# Patient Record
Sex: Female | Born: 1995
Health system: Southern US, Community
[De-identification: ages and names within clinical notes are randomized; demographics above are authoritative.]

## PROBLEM LIST (undated history)

## (undated) DIAGNOSIS — F419 Anxiety disorder, unspecified: Secondary | ICD-10-CM

## (undated) HISTORY — PX: TONSILLECTOMY: SUR1361

## (undated) HISTORY — DX: Anxiety disorder, unspecified: F41.9

---
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
## 2017-02-09 ENCOUNTER — Telehealth: Payer: Self-pay | Admitting: Obstetrics & Gynecology

## 2017-02-09 NOTE — Telephone Encounter (Signed)
[facility] referring to nexplanon . Called and left voicemail for patient to call back to be schedule

## 2017-02-10 NOTE — Telephone Encounter (Signed)
Pt is schedule 02/11/17 with ABC

## 2017-02-10 NOTE — Telephone Encounter (Signed)
[facility] referring for Nexplanon. Pt is schedule 02/14/17 with ABC

## 2017-02-11 ENCOUNTER — Ambulatory Visit: Payer: Self-pay | Admitting: Obstetrics and Gynecology

## 2017-02-11 NOTE — Telephone Encounter (Signed)
PT r/s schedeule to 12/17/18at 10 am with ABC

## 2017-02-11 NOTE — Telephone Encounter (Signed)
Nexplanon stock available for this patient. 

## 2017-02-14 ENCOUNTER — Encounter: Payer: Self-pay | Admitting: Obstetrics and Gynecology

## 2017-02-14 ENCOUNTER — Ambulatory Visit: Payer: Self-pay | Admitting: Obstetrics and Gynecology

## 2017-02-14 ENCOUNTER — Ambulatory Visit (INDEPENDENT_AMBULATORY_CARE_PROVIDER_SITE_OTHER): Payer: Medicaid Other | Admitting: Obstetrics and Gynecology

## 2017-02-14 VITALS — BP 112/68 | HR 77 | Ht 66.0 in | Wt 237.0 lb

## 2017-02-14 DIAGNOSIS — Z30017 Encounter for initial prescription of implantable subdermal contraceptive: Secondary | ICD-10-CM

## 2017-02-14 LAB — POCT URINE PREGNANCY: Preg Test, Ur: NEGATIVE

## 2017-02-14 NOTE — Progress Notes (Signed)
Chief Complaint  Patient presents with  . Contraception    Nexplanon insert/Referred [facility]    HPI:      Teresa Wang is a 21 y.o. G0P0000 who LMP was Patient's last menstrual period was 01/31/2017 (exact date)., presents today for nexplanon insertion, referred by PCP. Pt had nexplanon in the past and was amenorrheic with it, no side effects. She had it removed because she couldn't feel it and started OCPs. She forgets to take OCPs daily, so wants nexplanon again. She stopped OCPs with last menses and has been sex active since, not using condoms/BC.  Menses are monthly, lasting 3-5 days, no BTB. She has dysmen treating with ibup/heating pad with relief.    Past Medical History:  Diagnosis Date  . Anxiety     Past Surgical History:  Procedure Laterality Date  . TONSILLECTOMY      Family History  Problem Relation Age of Onset  . Colon cancer Maternal Grandfather     Social History   Socioeconomic History  . Marital status: Single    Spouse name: Not on file  . Number of children: Not on file  . Years of education: Not on file  . Highest education level: Not on file  Social Needs  . Financial resource strain: Not on file  . Food insecurity - worry: Not on file  . Food insecurity - inability: Not on file  . Transportation needs - medical: Not on file  . Transportation needs - non-medical: Not on file  Occupational History  . Not on file  Tobacco Use  . Smoking status: Never Smoker  . Smokeless tobacco: Never Used  Substance and Sexual Activity  . Alcohol use: No    Frequency: Never  . Drug use: No  . Sexual activity: Yes    Partners: Male    Birth control/protection: None  Other Topics Concern  . Not on file  Social History Narrative  . Not on file     Current Outpatient Medications:  .  buPROPion (WELLBUTRIN XL) 150 MG 24 hr tablet, TK 1 T PO QD, Disp: , Rfl: 2 .  cephALEXin (KEFLEX) 500 MG capsule, TK ONE C PO TID, Disp: , Rfl:  0 .  DIFFERIN 0.3 % gel, , Disp: , Rfl: 3 .  valACYclovir (VALTREX) 1000 MG tablet, TK 2 TS PO BID, Disp: , Rfl: 0   ROS:  Review of Systems  Constitutional: Negative for fever.  Gastrointestinal: Negative for blood in stool, constipation, diarrhea, nausea and vomiting.  Genitourinary: Negative for dyspareunia, dysuria, flank pain, frequency, hematuria, urgency, vaginal bleeding, vaginal discharge and vaginal pain.  Musculoskeletal: Negative for back pain.  Skin: Negative for rash.     OBJECTIVE:   Vitals:  BP 112/68 (BP Location: Left Arm, Patient Position: Sitting, Cuff Size: Large)   Pulse 77   Ht 5\' 6"  (1.676 m)   Wt 237 lb (107.5 kg)   LMP 01/31/2017 (Exact Date)   BMI 38.25 kg/m   Physical Exam  Constitutional: She is oriented to person, place, and time and well-developed, well-nourished, and in no distress.  Neurological: She is alert and oriented to person, place, and time.  Psychiatric: Memory, affect and judgment normal.  Vitals reviewed.   Results: Results for orders placed or performed in visit on 02/14/17 (from the past 24 hour(s))  POCT urine pregnancy     Status: Normal   Collection Time: 02/14/17 10:36 AM  Result Value Ref Range   Preg  Test, Ur Negative Negative     Assessment/Plan: Encounter for initial prescription of implantable subdermal contraceptive - Neg UPT today but not accurate given where she is in cycle, not using any condoms. RTO wtih menses for insertion. Nexplanon insertion/side effects discussed. - Plan: POCT urine pregnancy Condoms in meantime.   Return with menses for insertion.  Alicia B. Copland, PA-C 02/14/2017 10:58 AM

## 2017-02-14 NOTE — Patient Instructions (Signed)
I value your feedback and entrusting us with your care. If you get a [facility] patient survey, I would appreciate you taking the time to let us know about your experience today. Thank you! 

## 2017-03-02 ENCOUNTER — Telehealth: Payer: Self-pay | Admitting: Obstetrics and Gynecology

## 2017-03-02 NOTE — Telephone Encounter (Signed)
1/8 at 8:50 pt will be coming for nexplanon with abc

## 2017-03-08 ENCOUNTER — Ambulatory Visit (INDEPENDENT_AMBULATORY_CARE_PROVIDER_SITE_OTHER): Payer: Medicaid Other | Admitting: Obstetrics and Gynecology

## 2017-03-08 ENCOUNTER — Ambulatory Visit: Payer: Medicaid Other | Admitting: Obstetrics and Gynecology

## 2017-03-08 ENCOUNTER — Encounter: Payer: Self-pay | Admitting: Obstetrics and Gynecology

## 2017-03-08 VITALS — BP 114/70 | Ht 67.0 in

## 2017-03-08 DIAGNOSIS — Z30017 Encounter for initial prescription of implantable subdermal contraceptive: Secondary | ICD-10-CM | POA: Diagnosis not present

## 2017-03-08 MED ORDER — ETONOGESTREL 68 MG ~~LOC~~ IMPL: 68.0000 mg | DRUG_IMPLANT | Freq: Once | SUBCUTANEOUS | Status: DC

## 2017-03-08 NOTE — Patient Instructions (Signed)
I value your feedback and entrusting us with your care. If you get a [place] patient survey, I would appreciate you taking the time to let us know about your experience today. Thank you!  Remove the dressing in 12-24 hours,  keep the incision area dry for 24 hours and remove the Steristrip in 2-3  days.  Notify us if any signs of tenderness, redness, pain, or fevers develop. 

## 2017-03-08 NOTE — Progress Notes (Signed)
   Chief Complaint  Patient presents with  . Contraception     HPI:  Teresa Wang is a 22 y.o. G0P0000 here for Nexplanon insertion. No GYN concerns.  Patient's last menstrual period was 02/28/2017. Not sex active since insertion. [facility]. Had one in the past and did well.   BP 114/70   Ht 5\' 7"  (1.702 m)   LMP 02/28/2017   BMI 37.12 kg/m    Nexplanon Insertion  Patient given informed consent, signed copy in the chart, time out was performed.  Appropriate time out taken.  Patient's LEFT arm was prepped and draped in the usual sterile fashion. The ruler used to measure and mark insertion area.  Pt was prepped with betadine swab and then injected with 1.0 cc of 2% lidocaine with epinephrine. Nexplanon removed form packaging,  Device confirmed in needle, then inserted full length of needle and withdrawn per handbook instructions.  Pt insertion site covered with steri-strip and a bandage.   Minimal blood loss.  Pt tolerated the procedure welL.  Assessment: Nexplanon insertion - Plan: etonogestrel (NEXPLANON) implant 68 mg   Meds ordered this encounter  Medications  . etonogestrel (NEXPLANON) implant 68 mg     Plan:   She was told to remove the dressing in 12-24 hours, to keep the incision area dry for 24 hours and to remove the Steristrip in 2-3  days.  Notify us if any signs of tenderness, redness, pain, or fevers develop.   Per Beagley B. Roen Macgowan, PA-C 03/08/2017 3:50 PM

## 2017-04-26 NOTE — Telephone Encounter (Signed)
Unable to place Nexplanon r/s on next menses.

## 2017-04-26 NOTE — Telephone Encounter (Signed)
Nexplanon received 03/08/17 

## 2018-02-15 DIAGNOSIS — R55 Syncope and collapse: Secondary | ICD-10-CM | POA: Diagnosis not present

## 2018-02-15 DIAGNOSIS — E6609 Other obesity due to excess calories: Secondary | ICD-10-CM | POA: Diagnosis not present

## 2018-02-15 DIAGNOSIS — Z131 Encounter for screening for diabetes mellitus: Secondary | ICD-10-CM | POA: Diagnosis not present

## 2018-02-15 DIAGNOSIS — E668 Other obesity: Secondary | ICD-10-CM | POA: Diagnosis not present

## 2018-02-15 DIAGNOSIS — R5383 Other fatigue: Secondary | ICD-10-CM | POA: Diagnosis not present

## 2018-02-15 DIAGNOSIS — L249 Irritant contact dermatitis, unspecified cause: Secondary | ICD-10-CM | POA: Diagnosis not present

## 2018-03-27 DIAGNOSIS — E668 Other obesity: Secondary | ICD-10-CM | POA: Diagnosis not present

## 2018-03-27 DIAGNOSIS — L732 Hidradenitis suppurativa: Secondary | ICD-10-CM | POA: Diagnosis not present

## 2018-03-27 DIAGNOSIS — R5383 Other fatigue: Secondary | ICD-10-CM | POA: Diagnosis not present

## 2018-03-27 DIAGNOSIS — F419 Anxiety disorder, unspecified: Secondary | ICD-10-CM | POA: Diagnosis not present

## 2018-03-27 DIAGNOSIS — Z1331 Encounter for screening for depression: Secondary | ICD-10-CM | POA: Diagnosis not present

## 2018-07-18 ENCOUNTER — Encounter: Payer: Self-pay | Admitting: Obstetrics and Gynecology

## 2018-07-18 ENCOUNTER — Ambulatory Visit (INDEPENDENT_AMBULATORY_CARE_PROVIDER_SITE_OTHER): Payer: 59 | Admitting: Obstetrics and Gynecology

## 2018-07-18 ENCOUNTER — Other Ambulatory Visit: Payer: Self-pay

## 2018-07-18 VITALS — BP 124/74 | Ht 66.0 in | Wt 233.0 lb

## 2018-07-18 DIAGNOSIS — Z3049 Encounter for surveillance of other contraceptives: Secondary | ICD-10-CM

## 2018-07-18 DIAGNOSIS — Z3046 Encounter for surveillance of implantable subdermal contraceptive: Secondary | ICD-10-CM

## 2018-07-18 NOTE — Progress Notes (Signed)
   Chief Complaint  Patient presents with  . Contraception    Nexplanon removal      History of Present Illness:  ELLYN Wang is a 23 y.o. that had a nexplanon placed 1/19. Since that time, she has had some irregular menses and would rather be off BC. She is sex active and plans to use condoms. She is past due for annual/STD testing and will sched.   Nexplanon removal Pt gave consent for procedure/form signed. Procedure note - The Nexplanon was noted in the patient's arm and the end was identified. The skin was cleansed with a Betadine solution. A small injection of subcutaneous lidocaine with epinephrine was given over the end of the implant. An incision was made at the end of the implant. The rod was noted in the incision and grasped with a hemostat. It was noted to be intact.  Steri-Strip was placed approximating the incision. Hemostasis was noted.  Assessment: Nexplanon removal Use condoms  Plan:   She was told to remove the dressing in 12-24 hours, to keep the incision area dry for 24 hours and to remove the Steristrip in 2-3  days.  Notify us if any signs of tenderness, redness, pain, or fevers develop.  Return in about 4 weeks (around 08/15/2018) for annual.   Alicia B. Copland, PA-C 07/18/2018 3:13 PM

## 2018-07-18 NOTE — Patient Instructions (Addendum)
I value your feedback and entrusting us with your care. If you get a [facility] patient survey, I would appreciate you taking the time to let us know about your experience today. Thank you!  Remove the dressing in 24 hours,  keep the incision area dry for 24 hours and remove the Steristrip in 2-3  days.  Notify us if any signs of tenderness, redness, pain, or fevers develop.  

## 2018-08-21 NOTE — Progress Notes (Signed)
This encounter was created in error - please disregard.

## 2018-08-22 ENCOUNTER — Encounter: Payer: 59 | Admitting: Obstetrics and Gynecology

## 2018-08-24 NOTE — Patient Instructions (Signed)
I value your feedback and entrusting us with your care. If you get a [facility] patient survey, I would appreciate you taking the time to let us know about your experience today. Thank you! 

## 2018-09-11 ENCOUNTER — Other Ambulatory Visit: Payer: Self-pay

## 2018-09-11 ENCOUNTER — Emergency Department
Admission: EM | Admit: 2018-09-11 | Discharge: 2018-09-11 | Disposition: A | Discharge status: Home / Self Care | Payer: 59 | Attending: Emergency Medicine | Admitting: Emergency Medicine

## 2018-09-11 ENCOUNTER — Encounter: Payer: Self-pay | Admitting: Emergency Medicine

## 2018-09-11 DIAGNOSIS — Z79899 Other long term (current) drug therapy: Secondary | ICD-10-CM | POA: Insufficient documentation

## 2018-09-11 DIAGNOSIS — R05 Cough: Secondary | ICD-10-CM | POA: Insufficient documentation

## 2018-09-11 DIAGNOSIS — Z20828 Contact with and (suspected) exposure to other viral communicable diseases: Secondary | ICD-10-CM | POA: Diagnosis not present

## 2018-09-11 DIAGNOSIS — Z20822 Contact with and (suspected) exposure to covid-19: Secondary | ICD-10-CM

## 2018-09-11 NOTE — ED Triage Notes (Signed)
Cough x 3 days.  Also c/o sneezing and runny nose since thrusday.  Also some nausea.

## 2018-09-11 NOTE — ED Notes (Signed)
See triage note  States she developed cough last Thursday  Also has had some runny nose and sneezing  Denies any fever

## 2018-09-11 NOTE — ED Provider Notes (Signed)
[facility] Emergency Department Provider Note  ____________________________________________  Time seen: Approximately 4:40 PM  I have reviewed the triage vital signs and the nursing notes.   HISTORY  Chief Complaint Cough    HPI Teresa Wang is a 23 y.o. female who presents the emergency department complaining of chills, nasal congestion, sore throat, cough, nausea, diarrhea.  Patient reports that she has had 5 days of symptoms prior to arrival.  Patient's boyfriend is also being seen with similar symptoms.  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  Patient has been using multiple over-the-counter medications with no change in her symptoms.  Patient denies any headache, neck pain or stiffness, chest pain, shortness of breath or difficulty breathing, domino pain, nausea or vomiting.          Past Medical History:  Diagnosis Date  . Anxiety     There are no active problems to display for this patient.   Past Surgical History:  Procedure Laterality Date  . TONSILLECTOMY      Prior to Admission medications   Medication Sig Start Date End Date Taking? Authorizing Provider  buPROPion (WELLBUTRIN XL) 150 MG 24 hr tablet TK 1 T PO QD 02/11/17   [provider]  DIFFERIN 0.3 % gel  01/04/17   [provider]    Allergies Penicillins  Family History  Problem Relation Age of Onset  . Colon cancer Maternal Grandfather     Social History Social History   Tobacco Use  . Smoking status: Never Smoker  . Smokeless tobacco: Never Used  Substance Use Topics  . Alcohol use: No    Frequency: Never  . Drug use: No     Review of Systems  Constitutional: No fever but positive for chills. Positive body aches Eyes: No visual changes. No discharge ENT: Positive for nasal congestion and sore throat Cardiovascular: no chest pain. Respiratory: Positive cough. No  SOB. Gastrointestinal: No abdominal pain.  No nausea, no vomiting.  Positive diarrhea.  No constipation. Musculoskeletal: Negative for musculoskeletal pain. Skin: Negative for rash, abrasions, lacerations, ecchymosis. Neurological: Negative for headaches, focal weakness or numbness. 10-point ROS otherwise negative.  ____________________________________________   PHYSICAL EXAM:  VITAL SIGNS: ED Triage Vitals  Enc Vitals Group     BP 09/11/18 1550 135/80     Pulse Rate 09/11/18 1550 95     Resp 09/11/18 1550 16     Temp 09/11/18 1550 98.3 F (36.8 C)     Temp Source 09/11/18 1550 Oral     SpO2 09/11/18 1550 99 %     Weight 09/11/18 1544 233 lb 0.4 oz (105.7 kg)     Height --      Head Circumference --      Peak Flow --      Pain Score 09/11/18 1544 0     Pain Loc --      Pain Edu? --      Excl. in [place]? --      Constitutional: Alert and oriented. Well appearing and in no acute distress. Eyes: Conjunctivae are normal. PERRL. EOMI. Head: Atraumatic. ENT:      Ears:       Nose: No congestion/rhinnorhea.      Mouth/Throat: Mucous membranes are moist.  No oropharyngeal erythema or edema Neck: No stridor.  Neck is supple full range of motion Hematological/Lymphatic/Immunilogical: Scattered, mobile, nontender anterior cervical lymphadenopathy. Cardiovascular: Normal rate, regular rhythm.  Normal S1 and S2.  Good peripheral circulation. Respiratory: Normal respiratory effort without tachypnea or retractions. Lungs CTAB. Good air entry to the bases with no decreased or absent breath sounds. Musculoskeletal: Full range of motion to all extremities. No gross deformities appreciated. Neurologic:  Normal speech and language. No gross focal neurologic deficits are appreciated.  Skin:  Skin is warm, dry and intact. No rash noted. Psychiatric: Mood and affect are normal. Speech and behavior are normal. Patient exhibits appropriate insight and  judgement.   ____________________________________________   LABS (all labs ordered are listed, but only abnormal results are displayed)  Labs Reviewed  NOVEL CORONAVIRUS, NAA (HOSPITAL ORDER, SEND-OUT TO REF LAB)   ____________________________________________  EKG   ____________________________________________  RADIOLOGY   No results found.  ____________________________________________    PROCEDURES  Procedure(s) performed:    Procedures    Medications - No data to display   ____________________________________________   INITIAL IMPRESSION / ASSESSMENT AND PLAN / ED COURSE  Pertinent labs & imaging results that were available during my care of the patient were reviewed by me and considered in my medical decision making (see chart for details).  Review of the [place] CSRS was performed in accordance of the [facility] prior to dispensing any controlled drugs.           Patient's diagnosis is consistent with COVID-19 like infection.  Patient presented to the emergency department with multiple COVID-19 symptoms.  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  Patient's boyfriend is also suffering with similar symptoms.  Multiple over-the-counter medications with no relief.  Exam was overall reassuring with no indication for labs or imaging other than a COVID-19 swab.  Patient is to continue over-the-counter medication for symptom relief.  Follow-up with primary care as needed.  Return to the emergency department for any worsening symptoms..  Patient is given ED precautions to return to the ED for any worsening or new symptoms.     ____________________________________________  FINAL CLINICAL IMPRESSION(S) / ED DIAGNOSES  Final diagnoses:  Suspected Covid-19 Virus Infection      NEW MEDICATIONS STARTED DURING THIS VISIT:  ED Discharge Orders    None          This chart was dictated using voice recognition  software/Dragon. Despite best efforts to proofread, errors can occur which can change the meaning. Any change was purely unintentional.    Racheal PatchesCuthriell,  D, PA-C 09/11/18 1657    Phineas SemenGoodman, [place], MD 09/11/18 1721

## 2018-09-13 LAB — NOVEL CORONAVIRUS, NAA (HOSP ORDER, SEND-OUT TO REF LAB; TAT 18-24 HRS): SARS-CoV-2, NAA: NOT DETECTED

## 2018-10-17 ENCOUNTER — Encounter: Payer: Self-pay | Admitting: Emergency Medicine

## 2018-10-17 ENCOUNTER — Emergency Department: Payer: 59

## 2018-10-17 ENCOUNTER — Other Ambulatory Visit: Payer: Self-pay

## 2018-10-17 ENCOUNTER — Emergency Department
Admission: EM | Admit: 2018-10-17 | Discharge: 2018-10-18 | Disposition: A | Discharge status: Home / Self Care | Payer: 59 | Attending: Emergency Medicine | Admitting: Emergency Medicine

## 2018-10-17 DIAGNOSIS — R05 Cough: Secondary | ICD-10-CM | POA: Diagnosis present

## 2018-10-17 DIAGNOSIS — R0602 Shortness of breath: Secondary | ICD-10-CM

## 2018-10-17 DIAGNOSIS — F329 Major depressive disorder, single episode, unspecified: Secondary | ICD-10-CM | POA: Diagnosis not present

## 2018-10-17 DIAGNOSIS — F32A Depression, unspecified: Secondary | ICD-10-CM

## 2018-10-17 DIAGNOSIS — F419 Anxiety disorder, unspecified: Secondary | ICD-10-CM | POA: Diagnosis not present

## 2018-10-17 DIAGNOSIS — Z79899 Other long term (current) drug therapy: Secondary | ICD-10-CM | POA: Diagnosis not present

## 2018-10-17 DIAGNOSIS — R059 Cough, unspecified: Secondary | ICD-10-CM

## 2018-10-17 NOTE — ED Triage Notes (Addendum)
Pt arrived via POV with reports of dry cough and shortness of breath.  Pt speaking in complete sentences without running out of breath. , denies any history of asthma. Pt reports slight cough as well. Pt reports sxs x 1 week.  Pt reports using e-cigarette -Juul.   Pt also reports sneezing and watery eyes as well.  Pt has been out in the sun recently too.   No distress noted in triage. Pt is not aware of any COVID contacts.

## 2018-10-17 NOTE — ED Notes (Signed)
Pt reports anxiety recently. States that when she feels sob her anxiety increases. Pt is not in any anxiety medication and has no history of anxiety. Pt states she feels anxious right now.

## 2018-10-18 ENCOUNTER — Encounter: Payer: Self-pay | Admitting: Behavioral Health

## 2018-10-18 DIAGNOSIS — R05 Cough: Secondary | ICD-10-CM | POA: Diagnosis not present

## 2018-10-18 DIAGNOSIS — R0602 Shortness of breath: Secondary | ICD-10-CM | POA: Diagnosis not present

## 2018-10-18 DIAGNOSIS — F419 Anxiety disorder, unspecified: Secondary | ICD-10-CM | POA: Diagnosis not present

## 2018-10-18 MED ORDER — HYDROXYZINE HCL 10 MG PO TABS: 10.0000 mg | ORAL_TABLET | Freq: Three times a day (TID) | ORAL | 0 refills | Status: DC | PRN

## 2018-10-18 NOTE — Discharge Instructions (Signed)
You have been seen in the Emergency Department (ED)  today for a psychiatric complaint.  You have been evaluated by psychiatry and we believe you are safe to be discharged from the hospital.   ° °Please return to the Emergency Department (ED)  immediately if you have ANY thoughts of hurting yourself or anyone else, so that we may help you. ° °Please avoid alcohol and drug use. ° °Follow up with your doctor and/or therapist as soon as possible regarding today's ED  visit.  ° °You may call crisis hotline for [place] at [phone]. ° °

## 2018-10-18 NOTE — BH Assessment (Signed)
Pt was given resources for out patient treatment.
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
## 2018-10-18 NOTE — ED Notes (Signed)
URINE PREG NEGATIVE 

## 2018-11-22 ENCOUNTER — Other Ambulatory Visit: Payer: Self-pay

## 2018-11-22 ENCOUNTER — Ambulatory Visit (INDEPENDENT_AMBULATORY_CARE_PROVIDER_SITE_OTHER): Payer: Self-pay | Admitting: Obstetrics and Gynecology

## 2018-11-22 ENCOUNTER — Other Ambulatory Visit: Payer: Self-pay | Admitting: Obstetrics and Gynecology

## 2018-11-22 ENCOUNTER — Encounter: Payer: Self-pay | Admitting: Obstetrics and Gynecology

## 2018-11-22 ENCOUNTER — Other Ambulatory Visit (HOSPITAL_COMMUNITY)
Admission: RE | Admit: 2018-11-22 | Discharge: 2018-11-22 | Disposition: A | Discharge status: Home / Self Care | Payer: Self-pay | Source: Ambulatory Visit | Attending: Obstetrics and Gynecology | Admitting: Obstetrics and Gynecology

## 2018-11-22 VITALS — BP 112/70 | HR 70 | Ht 67.0 in | Wt 227.0 lb

## 2018-11-22 DIAGNOSIS — H9312 Tinnitus, left ear: Secondary | ICD-10-CM

## 2018-11-22 DIAGNOSIS — R300 Dysuria: Secondary | ICD-10-CM

## 2018-11-22 DIAGNOSIS — N926 Irregular menstruation, unspecified: Secondary | ICD-10-CM

## 2018-11-22 DIAGNOSIS — Z113 Encounter for screening for infections with a predominantly sexual mode of transmission: Secondary | ICD-10-CM | POA: Insufficient documentation

## 2018-11-22 DIAGNOSIS — Z3202 Encounter for pregnancy test, result negative: Secondary | ICD-10-CM

## 2018-11-22 DIAGNOSIS — N3001 Acute cystitis with hematuria: Secondary | ICD-10-CM

## 2018-11-22 DIAGNOSIS — Z3490 Encounter for supervision of normal pregnancy, unspecified, unspecified trimester: Secondary | ICD-10-CM

## 2018-11-22 LAB — POCT URINE PREGNANCY: Preg Test, Ur: NEGATIVE

## 2018-11-22 MED ORDER — NITROFURANTOIN MONOHYD MACRO 100 MG PO CAPS: 100.0000 mg | ORAL_CAPSULE | Freq: Two times a day (BID) | ORAL | 1 refills | Status: AC

## 2018-11-22 NOTE — Progress Notes (Signed)
Patient ID: Teresa Wang, female   DOB: 1995-06-27, 23 y.o.   MRN: [id number]  Reason for Consult: Menstrual Problem (Started period on 9/4 then came again on 9/19 both light, taken 3 test, having N/V, headaches, and frequent urination )   Referred by [facility]  Subjective:     HPI:  Teresa Wang is a 23 y.o. female. She is here today for abnormal uterine bleeding.  She reports that earliere this month she had a period on September 4th, 5th, and 6th. She then had bleeding and spotting on the 19th. She has not had bleeding since but the one day of abnormal bleeding was why she scheduled the visit. She reports that she had a nexplanon removed in May of 2020. Since that time she had a monthly period which normally lasts 5-7 days. She has had moderate flow with mild cramps. Her most recent period at the beginning of September was a lighter flow but with terrible cramps.   She is wondering if she might be pregnant and if her bleeding was implantation bleeding. She says that she is not actively trying to conceive but that she would like to be pregnant. She is not using birth control at this time.   She reports that with the Nexplanon she had irregular periods and amenorrhea. She was not happy with the bleeding pattern. However she did use this form of birth control for 4 years. She reports that she has phantom pain in her arm from the Nexplanon. She describes it as a brief sharp pain in the left arm which occurs about once a week.   She has taken OCP in the past but she has a difficult time remebering to take the pill every day.   She reports pain with urination.   She also repots diffuse headaches which generally last for several minutes and then go away. She does not take medication for these headaches.  She denies vision changes. She does not have an aura before the headahce. She does not have nasal drainage associated with the headaches.  She does report a ringing in her  left ear. She has not had a hearing test.    Gynecological History Menarche: 5th grade LMP: November 03, 2018 Describes periods as moderate flow Last pap smear: Never Last Mammogram: never History of STDs: Denies Sexually Active: Yes, denies dyspareunia   Obstetrical History G0P0  Past Medical History:  Diagnosis Date  . Anxiety    Family History  Problem Relation Age of Onset  . Colon cancer Maternal Grandfather    Past Surgical History:  Procedure Laterality Date  . TONSILLECTOMY      Short Social History:  Social History   Tobacco Use  . Smoking status: Never Smoker  . Smokeless tobacco: Never Used  Substance Use Topics  . Alcohol use: No    Frequency: Never    Allergies  Allergen Reactions  . Penicillins Rash    Current Outpatient Medications  Medication Sig Dispense Refill  . buPROPion (WELLBUTRIN XL) 150 MG 24 hr tablet TK 1 T PO QD  2  . DIFFERIN 0.3 % gel   3  . hydrOXYzine (ATARAX/VISTARIL) 10 MG tablet Take 1 tablet (10 mg total) by mouth 3 (three) times daily as needed. 30 tablet 0   Current Facility-Administered Medications  Medication Dose Route Frequency Provider Last Rate Last Dose  . etonogestrel (NEXPLANON) implant 68 mg  68 mg Subdermal Once Copland, Ilona Sorrel, PA-C  Review of Systems  Constitutional: Positive for fatigue. Negative for chills, fever and unexpected weight change.  HENT: Negative for trouble swallowing.  Eyes: Negative for loss of vision.  Respiratory: Negative for cough, shortness of breath and wheezing.       + sneezing Cardiovascular: Negative for chest pain, leg swelling, palpitations and syncope.  GI: Positive for nausea. Negative for abdominal pain, blood in stool, diarrhea and vomiting.       +constipation GU: Positive for frequency. Negative for difficulty urinating, dysuria and hematuria.  Musculoskeletal: Negative for back pain, leg pain and joint pain.       +breast tenderness Skin: Negative for rash.   Neurological: Positive for headaches. Negative for dizziness, light-headedness, numbness and seizures.  Psychiatric: Negative for behavioral problem, confusion, depressed mood and sleep disturbance.        Objective:  Objective   Vitals:   11/22/18 1339  BP: 112/70  Pulse: 70  Weight: 227 lb (103 kg)  Height: 5\' 7"  (1.702 m)   Body mass index is 35.55 kg/m.  Physical Exam Vitals signs and nursing note reviewed.  Constitutional:      Appearance: She is well-developed.  HENT:     Head: Normocephalic and atraumatic.  Eyes:     Pupils: Pupils are equal, round, and reactive to light.  Cardiovascular:     Rate and Rhythm: Normal rate and regular rhythm.  Pulmonary:     Effort: Pulmonary effort is normal. No respiratory distress.  Abdominal:     General: Abdomen is flat.     Palpations: Abdomen is soft.  Genitourinary:    Comments: Declines pelvic exam today Skin:    General: Skin is warm and dry.  Neurological:     Mental Status: She is alert and oriented to person, place, and time.  Psychiatric:        Behavior: Behavior normal.        Thought Content: Thought content normal.        Judgment: Judgment normal.        Assessment/Wang:     23 yo G0P0000 1. Irregular bleeding, one time event. Pregnancy test negative today. Will obtain betahcg to confirm given possible early pregnancy and patient concern. 2. Urinary dysuria and frequency- presumptive UTI treatment, rx and urine culture sent.  3. Headaches and hearing changes, will refer to ENT for further evaluation and hearing testing.  4. Discussed need for pap smear and  STD testing, patient declines pap today. Will need at her next visit.     Adrian Prows MD [facility], [facility] 11/22/2018 2:25 PM

## 2018-11-23 LAB — BETA HCG QUANT (REF LAB): hCG Quant: <1 m[IU]/mL

## 2018-11-23 NOTE — Progress Notes (Signed)
Negative. Released to mychart with a note.

## 2018-11-24 LAB — URINE CYTOLOGY ANCILLARY ONLY
Chlamydia: NEGATIVE
Neisseria Gonorrhea: NEGATIVE
Trichomonas: NEGATIVE

## 2018-11-24 LAB — URINE CULTURE

## 2018-11-27 NOTE — Progress Notes (Signed)
WNL

## 2018-11-27 NOTE — Progress Notes (Signed)
WNL, released to mychart with note

## 2018-12-06 ENCOUNTER — Ambulatory Visit: Payer: Medicaid Other | Admitting: Obstetrics and Gynecology

## 2019-04-09 NOTE — Progress Notes (Signed)
PCP:  [facility], Nature conservation officer Complaint  Patient presents with  . Gynecologic Exam    heavy periods, golf size clots, severe cramping  . Contraception    not interested in depo or nexplanon     HPI:      Ms. Teresa Wang is a 24 y.o. G0P0000 who LMP was Patient's last menstrual period was 03/18/2019 (exact date)., presents today for her annual examination.  Her menses are regular every 28-30 days, lasting 5 days, mod to heavy flow, with clots. Sx worse since nexplanon removed last yr.  Dysmenorrhea moderate, improved with midol/heating pad. She does not have intermenstrual bleeding.  Sex activity: single partner, contraception - none. Nexplanon placed 03/18/17, removed 5/20. Would like BC, but not depo/nexplanon. Did OCPs in past without side effects. No hx of HTN, DVTs, migraines with aura.  Last Pap: never due to age Hx of STDs: none  There is no FH of breast cancer. There is no FH of ovarian cancer. The patient does not do self-breast exams.  Tobacco use: vapes daily Alcohol use: social drinker No drug use.  Exercise: not active  She does not get adequate calcium and Vitamin D in her diet. Unsure about Gardasil vaccine   Past Medical History:  Diagnosis Date  . Anxiety      Past Surgical History:  Procedure Laterality Date  . TONSILLECTOMY      Family History  Problem Relation Age of Onset  . Colon cancer Maternal Grandfather     Social History   Socioeconomic History  . Marital status: Single    Spouse name: Not on file  . Number of children: Not on file  . Years of education: Not on file  . Highest education level: Not on file  Occupational History  . Not on file  Tobacco Use  . Smoking status: Never Smoker  . Smokeless tobacco: Never Used  Substance and Sexual Activity  . Alcohol use: No  . Drug use: No  . Sexual activity: Yes    Partners: Male    Birth control/protection: None  Other Topics Concern  . Not on file  Social  History Narrative  . Not on file   Social Determinants of Health   Financial Resource Strain:   . Difficulty of Paying Living Expenses: Not on file  Food Insecurity:   . Worried About Charity fundraiser in the Last Year: Not on file  . Ran Out of Food in the Last Year: Not on file  Transportation Needs:   . Lack of Transportation (Medical): Not on file  . Lack of Transportation (Non-Medical): Not on file  Physical Activity:   . Days of Exercise per Week: Not on file  . Minutes of Exercise per Session: Not on file  Stress:   . Feeling of Stress : Not on file  Social Connections:   . Frequency of Communication with Friends and Family: Not on file  . Frequency of Social Gatherings with Friends and Family: Not on file  . Attends Religious Services: Not on file  . Active Member of Clubs or Organizations: Not on file  . Attends Archivist Meetings: Not on file  . Marital Status: Not on file  Intimate Partner Violence:   . Fear of Current or Ex-Partner: Not on file  . Emotionally Abused: Not on file  . Physically Abused: Not on file  . Sexually Abused: Not on file     Current Outpatient Medications:  .  Norethindrone Acetate-Ethinyl Estrad-FE (MICROGESTIN 24 FE) 1-20 MG-MCG(24) tablet, Take 1 tablet by mouth daily., Disp: 84 tablet, Rfl: 3    ROS:  Review of Systems  Constitutional: Negative for fatigue, fever and unexpected weight change.  Respiratory: Negative for cough, shortness of breath and wheezing.   Cardiovascular: Negative for chest pain, palpitations and leg swelling.  Gastrointestinal: Negative for blood in stool, constipation, diarrhea, nausea and vomiting.  Endocrine: Negative for cold intolerance, heat intolerance and polyuria.  Genitourinary: Positive for menstrual problem. Negative for dyspareunia, dysuria, flank pain, frequency, genital sores, hematuria, pelvic pain, urgency, vaginal bleeding, vaginal discharge and vaginal pain.  Musculoskeletal:  Negative for back pain, joint swelling and myalgias.  Skin: Negative for rash.  Neurological: Negative for dizziness, syncope, light-headedness, numbness and headaches.  Hematological: Negative for adenopathy.  Psychiatric/Behavioral: Negative for agitation, confusion, sleep disturbance and suicidal ideas. The patient is not nervous/anxious.   BREAST: No symptoms   Objective: BP 110/80   Ht 5\' 7"  (1.702 m)   Wt 249 lb (112.9 kg)   LMP 03/18/2019 (Exact Date)   BMI 39.00 kg/m    Physical Exam Constitutional:      Appearance: She is well-developed.  Genitourinary:     Vulva, vagina, cervix, uterus, right adnexa and left adnexa normal.     No vulval lesion or tenderness noted.     No vaginal discharge, erythema or tenderness.     No cervical polyp.     Uterus is not enlarged or tender.     No right or left adnexal mass present.     Right adnexa not tender.     Left adnexa not tender.  Neck:     Thyroid: No thyromegaly.  Cardiovascular:     Rate and Rhythm: Normal rate and regular rhythm.     Heart sounds: Normal heart sounds. No murmur.  Pulmonary:     Effort: Pulmonary effort is normal.     Breath sounds: Normal breath sounds.  Chest:     Breasts:        Right: No mass, nipple discharge, skin change or tenderness.        Left: No mass, nipple discharge, skin change or tenderness.  Abdominal:     Palpations: Abdomen is soft.     Tenderness: There is no abdominal tenderness. There is no guarding.  Musculoskeletal:        General: Normal range of motion.     Cervical back: Normal range of motion.  Neurological:     General: No focal deficit present.     Mental Status: She is alert and oriented to person, place, and time.     Cranial Nerves: No cranial nerve deficit.  Skin:    General: Skin is warm and dry.  Psychiatric:        Mood and Affect: Mood normal.        Behavior: Behavior normal.        Thought Content: Thought content normal.        Judgment: Judgment  normal.  Vitals reviewed.     Results: Results for orders placed or performed in visit on 04/10/19 (from the past 24 hour(s))  POCT hemoglobin     Status: Normal   Collection Time: 04/10/19 11:45 AM  Result Value Ref Range   Hemoglobin 11.4 11 - 14.6 g/dL    Assessment/Plan: Encounter for annual routine gynecological examination  Cervical cancer screening - Plan: Cytology - PAP  Screening for STD (sexually transmitted disease) - Plan:  Cytology - PAP  Encounter for initial prescription of contraceptive pills - Plan: Norethindrone Acetate-Ethinyl Estrad-FE (MICROGESTIN 24 FE) 1-20 MG-MCG(24) tablet; OCP start with next menses, condoms. Rx eRxd. F/u prn.   Menometrorrhagia - Plan: POCT hemoglobin, Norethindrone Acetate-Ethinyl Estrad-FE (MICROGESTIN 24 FE) 1-20 MG-MCG(24) tablet; Low normal HgB. Start OCPs. F/u prn.  Meds ordered this encounter  Medications  . Norethindrone Acetate-Ethinyl Estrad-FE (MICROGESTIN 24 FE) 1-20 MG-MCG(24) tablet    Sig: Take 1 tablet by mouth daily.    Dispense:  84 tablet    Refill:  3    Order Specific Question:   Supervising Provider    Answer:   Nadara Mustard [[id number]]             GYN counsel use and side effects of OCP's, adequate intake of calcium and vitamin D, diet and exercise     F/U  Return in about 1 year (around 04/09/2020).  Foy Vanduyne B. Areyana Leoni, PA-C 04/10/2019 11:53 AM

## 2019-04-10 ENCOUNTER — Other Ambulatory Visit: Payer: Self-pay

## 2019-04-10 ENCOUNTER — Encounter: Payer: Self-pay | Admitting: Obstetrics and Gynecology

## 2019-04-10 ENCOUNTER — Ambulatory Visit (INDEPENDENT_AMBULATORY_CARE_PROVIDER_SITE_OTHER): Payer: Medicaid Other | Admitting: Obstetrics and Gynecology

## 2019-04-10 ENCOUNTER — Other Ambulatory Visit (HOSPITAL_COMMUNITY)
Admission: RE | Admit: 2019-04-10 | Discharge: 2019-04-10 | Disposition: A | Discharge status: Home / Self Care | Payer: Medicaid Other | Source: Ambulatory Visit | Attending: Obstetrics and Gynecology | Admitting: Obstetrics and Gynecology

## 2019-04-10 VITALS — BP 110/80 | Ht 67.0 in | Wt 249.0 lb

## 2019-04-10 DIAGNOSIS — Z124 Encounter for screening for malignant neoplasm of cervix: Secondary | ICD-10-CM | POA: Diagnosis not present

## 2019-04-10 DIAGNOSIS — Z01419 Encounter for gynecological examination (general) (routine) without abnormal findings: Secondary | ICD-10-CM

## 2019-04-10 DIAGNOSIS — N921 Excessive and frequent menstruation with irregular cycle: Secondary | ICD-10-CM

## 2019-04-10 DIAGNOSIS — Z113 Encounter for screening for infections with a predominantly sexual mode of transmission: Secondary | ICD-10-CM | POA: Insufficient documentation

## 2019-04-10 DIAGNOSIS — Z30011 Encounter for initial prescription of contraceptive pills: Secondary | ICD-10-CM

## 2019-04-10 LAB — POCT HEMOGLOBIN: Hemoglobin: 11.4 g/dL (ref 11–14.6)

## 2019-04-10 MED ORDER — MICROGESTIN 24 FE 1-20 MG-MCG PO TABS: 1.0000 | ORAL_TABLET | Freq: Every day | ORAL | 3 refills | Status: DC

## 2019-04-10 NOTE — Patient Instructions (Signed)
I value your feedback and entrusting us with your care. If you get a [facility] patient survey, I would appreciate you taking the time to let us know about your experience today. Thank you!  As of February 08, 2019, your lab results will be released to your MyChart immediately, before I even have a chance to see them. Please give me time to review them and contact you if there are any abnormalities. Thank you for your patience.  

## 2019-04-12 LAB — CYTOLOGY - PAP
Chlamydia: NEGATIVE
Comment: NEGATIVE
Comment: NORMAL
Diagnosis: NEGATIVE
Neisseria Gonorrhea: NEGATIVE

## 2019-05-02 ENCOUNTER — Encounter: Payer: Self-pay | Admitting: Obstetrics and Gynecology

## 2019-05-16 ENCOUNTER — Encounter: Payer: Self-pay | Admitting: Obstetrics and Gynecology

## 2019-07-17 ENCOUNTER — Encounter: Payer: Self-pay | Admitting: Obstetrics and Gynecology

## 2019-07-17 ENCOUNTER — Telehealth: Payer: Medicaid Other | Admitting: Physician Assistant

## 2019-07-17 DIAGNOSIS — N926 Irregular menstruation, unspecified: Secondary | ICD-10-CM

## 2019-07-17 NOTE — Progress Notes (Signed)
Hi Dorene,   If desired, it is a common practice for women to take birth control pills in such a manner that it causes a missed period.  You will need to speak with your PCP or GYN directly  regarding more in depth specifics on this topic and/or if you would like a change in your birth control. This is not a problem we cover in E-visits.   Based on what you shared with me, I feel your condition warrants further evaluation and I recommend that you be seen for a face to face visit.  Please contact your primary care physician practice to be seen. Many offices offer virtual options to be seen via video if you are not comfortable going in person to a medical facility at this time.  If you do not have a PCP, [facility] offers a free physician referral service available at [phone]. Our trained staff has the experience, knowledge and resources to put you in touch with a physician who is right for you.   You also have the option of a video visit through [email or web address]  If you are having a true medical emergency please call 911.  NOTE: If you entered your credit card information for this eVisit, you will not be charged. You may see a "hold" on your card for the $35 but that hold will drop off and you will not have a charge processed.  Your e-visit answers were reviewed by a board certified advanced clinical practitioner to complete your personal care plan.  Thank you for using e-Visits.

## 2020-01-01 ENCOUNTER — Telehealth: Payer: Self-pay

## 2020-01-01 ENCOUNTER — Other Ambulatory Visit: Payer: Self-pay

## 2020-01-01 DIAGNOSIS — Z30011 Encounter for initial prescription of contraceptive pills: Secondary | ICD-10-CM

## 2020-01-01 DIAGNOSIS — N921 Excessive and frequent menstruation with irregular cycle: Secondary | ICD-10-CM

## 2020-01-01 MED ORDER — MICROGESTIN 24 FE 1-20 MG-MCG PO TABS: 1.0000 | ORAL_TABLET | Freq: Every day | ORAL | 3 refills | Status: DC

## 2020-01-05 IMAGING — DX PORTABLE CHEST - 1 VIEW
1 series · 1 of 1 positions shown · non-contrast
Comparison: None.

CLINICAL DATA: Dry cough, shortness of breath

EXAM:
PORTABLE CHEST 1 VIEW

[chest ap]
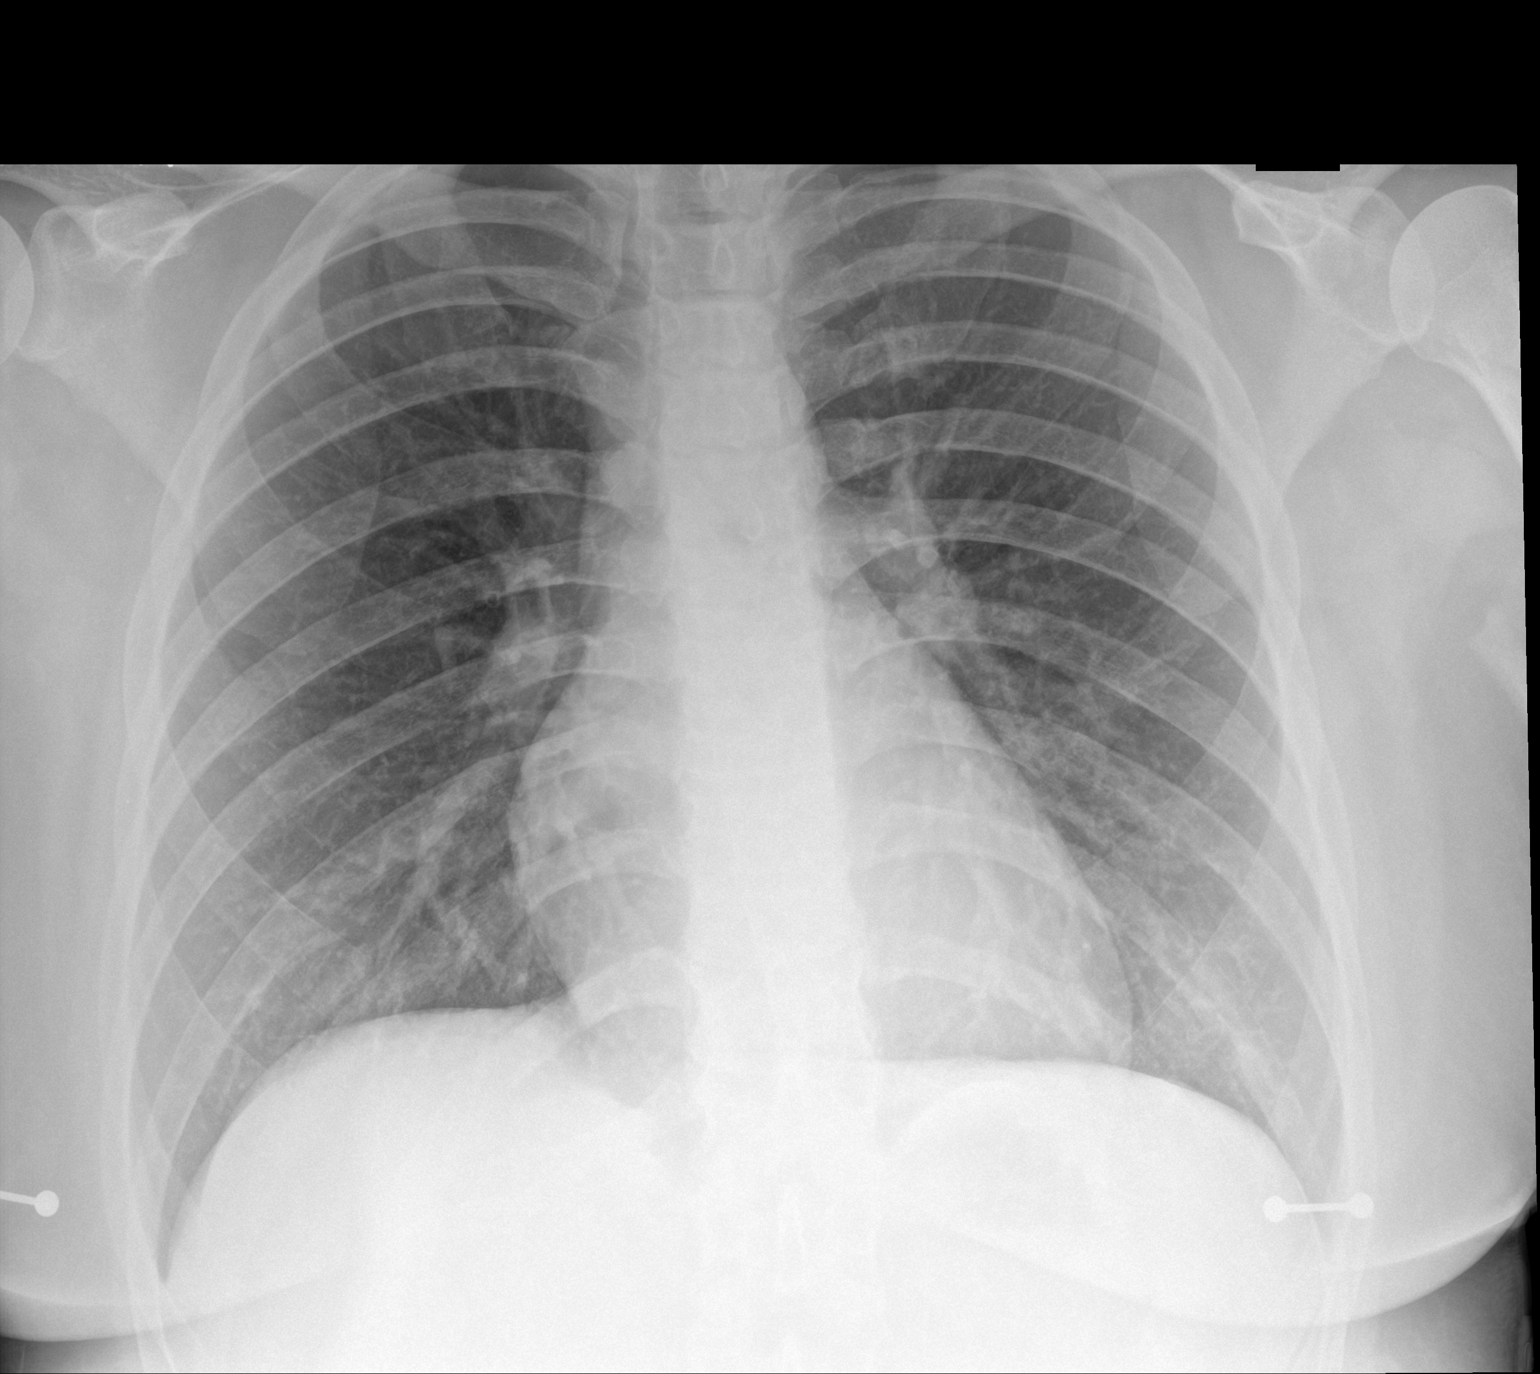

[1 of 1 positions shown; findings below may reference images not displayed]

FINDINGS: No consolidation, features of edema, pneumothorax, or effusion.
Pulmonary vascularity is normally distributed. The cardiomediastinal
contours are unremarkable. No acute osseous or soft tissue
abnormality.
IMPRESSION: No active disease.

## 2020-03-18 ENCOUNTER — Telehealth: Payer: Self-pay

## 2020-03-18 NOTE — Telephone Encounter (Signed)
Patient has been schedule for AE. She has no refills for her birth control. States she may need a blood pregnancy test.She doesn't have period d/t birth control, but is having odd symptoms making her think she is pregnant. She states she will address this at her appointment.

## 2020-03-19 NOTE — Telephone Encounter (Signed)
Patient aware.

## 2020-03-19 NOTE — Telephone Encounter (Signed)
Spoke w/pharmacy. They verified rx as sent on 01/01/20. Refills are available. They will get ready for pt to p/u.

## 2020-03-19 NOTE — Telephone Encounter (Signed)
Spoke w/patient. Advised according to Epic she should have refills available at the pharmacy to get her to her apt. However, I was unable to verify with pharmacy as I called three times and each time I was transferred to pharmacy, the call was disconnected. Patient states she has moved to [place] and no longer gets her meds at [facility]. Pharmacy changed to [facility] [place], [place]. She has been getting rx's filled at this pharmacy. Advised also to take pregnancy test prior to starting new pack of pills. Patient states she took one and it was negative. Her nipples have been sore and her areola are larger than normal. Admits to missing some pills and doubling up to catch up. She may wait to start new pack and retake pregnancy test in a few weeks. Advised to use back up protection. Will call [facility] to verify rx on file.

## 2020-04-23 ENCOUNTER — Encounter: Payer: Self-pay | Admitting: Obstetrics and Gynecology

## 2020-04-23 ENCOUNTER — Ambulatory Visit (INDEPENDENT_AMBULATORY_CARE_PROVIDER_SITE_OTHER): Payer: Medicaid Other | Admitting: Obstetrics and Gynecology

## 2020-04-23 ENCOUNTER — Other Ambulatory Visit (HOSPITAL_COMMUNITY)
Admission: RE | Admit: 2020-04-23 | Discharge: 2020-04-23 | Disposition: A | Discharge status: Home / Self Care | Payer: Medicaid Other | Source: Ambulatory Visit | Attending: Obstetrics and Gynecology | Admitting: Obstetrics and Gynecology

## 2020-04-23 ENCOUNTER — Other Ambulatory Visit: Payer: Self-pay

## 2020-04-23 VITALS — BP 110/80 | Ht 66.0 in | Wt 245.0 lb

## 2020-04-23 DIAGNOSIS — Z3202 Encounter for pregnancy test, result negative: Secondary | ICD-10-CM | POA: Diagnosis not present

## 2020-04-23 DIAGNOSIS — N926 Irregular menstruation, unspecified: Secondary | ICD-10-CM

## 2020-04-23 DIAGNOSIS — Z3041 Encounter for surveillance of contraceptive pills: Secondary | ICD-10-CM

## 2020-04-23 DIAGNOSIS — Z113 Encounter for screening for infections with a predominantly sexual mode of transmission: Secondary | ICD-10-CM

## 2020-04-23 DIAGNOSIS — Z01419 Encounter for gynecological examination (general) (routine) without abnormal findings: Secondary | ICD-10-CM

## 2020-04-23 LAB — POCT URINE PREGNANCY: Preg Test, Ur: NEGATIVE

## 2020-04-23 MED ORDER — MICROGESTIN 24 FE 1-20 MG-MCG PO TABS: 1.0000 | ORAL_TABLET | Freq: Every day | ORAL | 3 refills | Status: DC

## 2020-04-23 NOTE — Progress Notes (Signed)
PCP:  [facility], Production manager Complaint  Patient presents with  . Gynecologic Exam    No concerns     HPI:      Ms. Teresa Wang is a 25 y.o. G0P0000 who LMP was No LMP recorded. (Menstrual status: Oral contraceptives)., presents today for her annual examination.  Her menses are absent with OCPs. No BTB, no dysmen. Did have bleeding 1/22 after many late/missed pills that pill pack; also with cramping. Had neg UPT. Usually takes pills at same time every night.  Sex activity: single partner, contraception -OCPs. Had nexplanon in past.  Last Pap: 04/10/19 Results were normal Hx of STDs: none  There is no FH of breast cancer. There is no FH of ovarian cancer. The patient does not do self-breast exams.  Tobacco use: vapes daily, trying to quit Alcohol use: none No drug use.  Exercise: mod active  She does get adequate calcium but not Vitamin D in her diet. Unsure about Gardasil vaccine   Past Medical History:  Diagnosis Date  . Anxiety      Past Surgical History:  Procedure Laterality Date  . TONSILLECTOMY      Family History  Problem Relation Age of Onset  . Colon cancer Maternal Grandfather     Social History   Socioeconomic History  . Marital status: Single    Spouse name: Not on file  . Number of children: Not on file  . Years of education: Not on file  . Highest education level: Not on file  Occupational History  . Not on file  Tobacco Use  . Smoking status: Never Smoker  . Smokeless tobacco: Never Used  Vaping Use  . Vaping Use: Never used  Substance and Sexual Activity  . Alcohol use: No  . Drug use: No  . Sexual activity: Yes    Partners: Male    Birth control/protection: Pill  Other Topics Concern  . Not on file  Social History Narrative  . Not on file   Social Determinants of Health   Financial Resource Strain: Not on file  Food Insecurity: Not on file  Transportation Needs: Not on file  Physical Activity: Not on file   Stress: Not on file  Social Connections: Not on file  Intimate Partner Violence: Not on file     Current Outpatient Medications:  .  Norethindrone Acetate-Ethinyl Estrad-FE (MICROGESTIN 24 FE) 1-20 MG-MCG(24) tablet, Take 1 tablet by mouth daily., Disp: 84 tablet, Rfl: 3    ROS:  Review of Systems  Constitutional: Negative for fatigue, fever and unexpected weight change.  Respiratory: Negative for cough, shortness of breath and wheezing.   Cardiovascular: Negative for chest pain, palpitations and leg swelling.  Gastrointestinal: Positive for constipation. Negative for blood in stool, diarrhea, nausea and vomiting.  Endocrine: Negative for cold intolerance, heat intolerance and polyuria.  Genitourinary: Positive for menstrual problem. Negative for dyspareunia, dysuria, flank pain, frequency, genital sores, hematuria, pelvic pain, urgency, vaginal bleeding, vaginal discharge and vaginal pain.  Musculoskeletal: Negative for back pain, joint swelling and myalgias.  Skin: Negative for rash.  Neurological: Negative for dizziness, syncope, light-headedness, numbness and headaches.  Hematological: Negative for adenopathy.  Psychiatric/Behavioral: Negative for agitation, confusion, sleep disturbance and suicidal ideas. The patient is not nervous/anxious.   BREAST: No symptoms   Objective: BP 110/80   Ht 5\' 6"  (1.676 m)   Wt 245 lb (111.1 kg)   BMI 39.54 kg/m    Physical Exam Constitutional:  Appearance: She is well-developed.  Genitourinary:     Vulva normal.     Right Labia: No rash, tenderness or lesions.    Left Labia: No tenderness, lesions or rash.    No vaginal discharge, erythema or tenderness.      Right Adnexa: not tender and no mass present.    Left Adnexa: not tender and no mass present.    No cervical friability or polyp.     Uterus is not enlarged or tender.  Breasts:     Right: No mass, nipple discharge, skin change or tenderness.     Left: No mass,  nipple discharge, skin change or tenderness.    Neck:     Thyroid: No thyromegaly.  Cardiovascular:     Rate and Rhythm: Normal rate and regular rhythm.     Heart sounds: Normal heart sounds. No murmur heard.   Pulmonary:     Effort: Pulmonary effort is normal.     Breath sounds: Normal breath sounds.  Abdominal:     Palpations: Abdomen is soft.     Tenderness: There is no abdominal tenderness. There is no guarding or rebound.  Musculoskeletal:        General: Normal range of motion.     Cervical back: Normal range of motion.  Lymphadenopathy:     Cervical: No cervical adenopathy.  Neurological:     General: No focal deficit present.     Mental Status: She is alert and oriented to person, place, and time.     Cranial Nerves: No cranial nerve deficit.  Skin:    General: Skin is warm and dry.  Psychiatric:        Mood and Affect: Mood normal.        Behavior: Behavior normal.        Thought Content: Thought content normal.        Judgment: Judgment normal.  Vitals reviewed.     Results: Results for orders placed or performed in visit on 04/23/20 (from the past 24 hour(s))  POCT urine pregnancy     Status: Normal   Collection Time: 04/23/20  2:32 PM  Result Value Ref Range   Preg Test, Ur Negative Negative    Assessment/Plan: Encounter for annual routine gynecological examination  Screening for STD (sexually transmitted disease) - Plan: Cervicovaginal ancillary only  Encounter for surveillance of contraceptive pills - Plan: Norethindrone Acetate-Ethinyl Estrad-FE (MICROGESTIN 24 FE) 1-20 MG-MCG(24) tablet; OCP RF. Make sure to take at same time.   Irregular bleeding - Plan: POCT urine pregnancy; neg UPT, most likely due to missed pills. F/u prn.    Meds ordered this encounter  Medications  . Norethindrone Acetate-Ethinyl Estrad-FE (MICROGESTIN 24 FE) 1-20 MG-MCG(24) tablet    Sig: Take 1 tablet by mouth daily.    Dispense:  84 tablet    Refill:  3    Order  Specific Question:   Supervising Provider    Answer:   Nadara Mustard [[id number]]             GYN counsel use and side effects of OCP's, adequate intake of calcium and vitamin D, diet and exercise     F/U  Return in about 1 year (around 04/23/2021).  Wasim Hurlbut B. Linah Klapper, PA-C 04/23/2020 2:38 PM

## 2020-04-23 NOTE — Patient Instructions (Signed)
I value your feedback and you entrusting us with your care. If you get a [facility] patient survey, I would appreciate you taking the time to let us know about your experience today. Thank you! ? ? ?

## 2020-04-24 LAB — CERVICOVAGINAL ANCILLARY ONLY
Chlamydia: NEGATIVE
Comment: NEGATIVE
Comment: NORMAL
Neisseria Gonorrhea: NEGATIVE

## 2020-06-06 ENCOUNTER — Telehealth: Payer: Self-pay

## 2020-06-06 NOTE — Telephone Encounter (Signed)
Spoke w/patient. She stopped her birth control in March after her apt w/[facility]. She states it was making her feel depressed. She feels better/normal now that she's off of it. She had her first period this month (no period while she was on bc). It was heavy and her cramps were horrible. Discussed heavy bleeding protocol when to report to ED. She did use ibuprofen for cramping, it wasn't very helpful. She did get good relief with Naproxen. She will monitor and call back to discuss other options if this continues.

## 2020-06-06 NOTE — Telephone Encounter (Signed)
Patient has questions about period after stopping birth control. NL#[phone]

## 2020-09-15 ENCOUNTER — Encounter: Payer: Self-pay | Admitting: Obstetrics and Gynecology

## 2020-09-16 MED ORDER — MEDROXYPROGESTERONE ACETATE 10 MG PO TABS: 10.0000 mg | ORAL_TABLET | Freq: Every day | ORAL | 0 refills | Status: AC

## 2020-09-17 ENCOUNTER — Ambulatory Visit: Payer: Medicaid Other | Admitting: Obstetrics and Gynecology

## 2020-09-23 ENCOUNTER — Ambulatory Visit: Payer: Medicaid Other | Admitting: Obstetrics and Gynecology

## 2022-06-30 NOTE — Telephone Encounter (Signed)
Error
